# Patient Record
Sex: Female | Born: 1980 | Race: White | Hispanic: No | Marital: Single
Health system: Southern US, Community
[De-identification: ages and names within clinical notes are randomized; demographics above are authoritative.]

## PROBLEM LIST (undated history)

## (undated) DIAGNOSIS — F988 Other specified behavioral and emotional disorders with onset usually occurring in childhood and adolescence: Secondary | ICD-10-CM

## (undated) DIAGNOSIS — N809 Endometriosis, unspecified: Secondary | ICD-10-CM

## (undated) DIAGNOSIS — Z87440 Personal history of urinary (tract) infections: Secondary | ICD-10-CM

## (undated) HISTORY — PX: ABDOMINAL SURGERY: SHX537

---

## 2008-06-13 ENCOUNTER — Encounter: Admission: RE | Admit: 2008-06-13 | Discharge: 2008-06-13 | Payer: Self-pay | Admitting: Unknown Physician Specialty

## 2011-05-06 ENCOUNTER — Ambulatory Visit
Admission: RE | Admit: 2011-05-06 | Discharge: 2011-05-06 | Disposition: A | Discharge status: Home / Self Care | Payer: Self-pay | Source: Ambulatory Visit | Attending: Orthopedic Surgery | Admitting: Orthopedic Surgery

## 2011-05-06 ENCOUNTER — Other Ambulatory Visit: Payer: Self-pay | Admitting: Orthopedic Surgery

## 2011-05-06 DIAGNOSIS — M545 Low back pain, unspecified: Secondary | ICD-10-CM

## 2013-01-27 IMAGING — CR DG LUMBAR SPINE 2-3V
3 series · 3 of 3 positions shown · non-contrast
Comparison: 06/13/2008.

CLINICAL DATA: Low back pain.  No known injury.

LUMBAR SPINE - 2-3 VIEW

[view not recorded (1 of 3)]
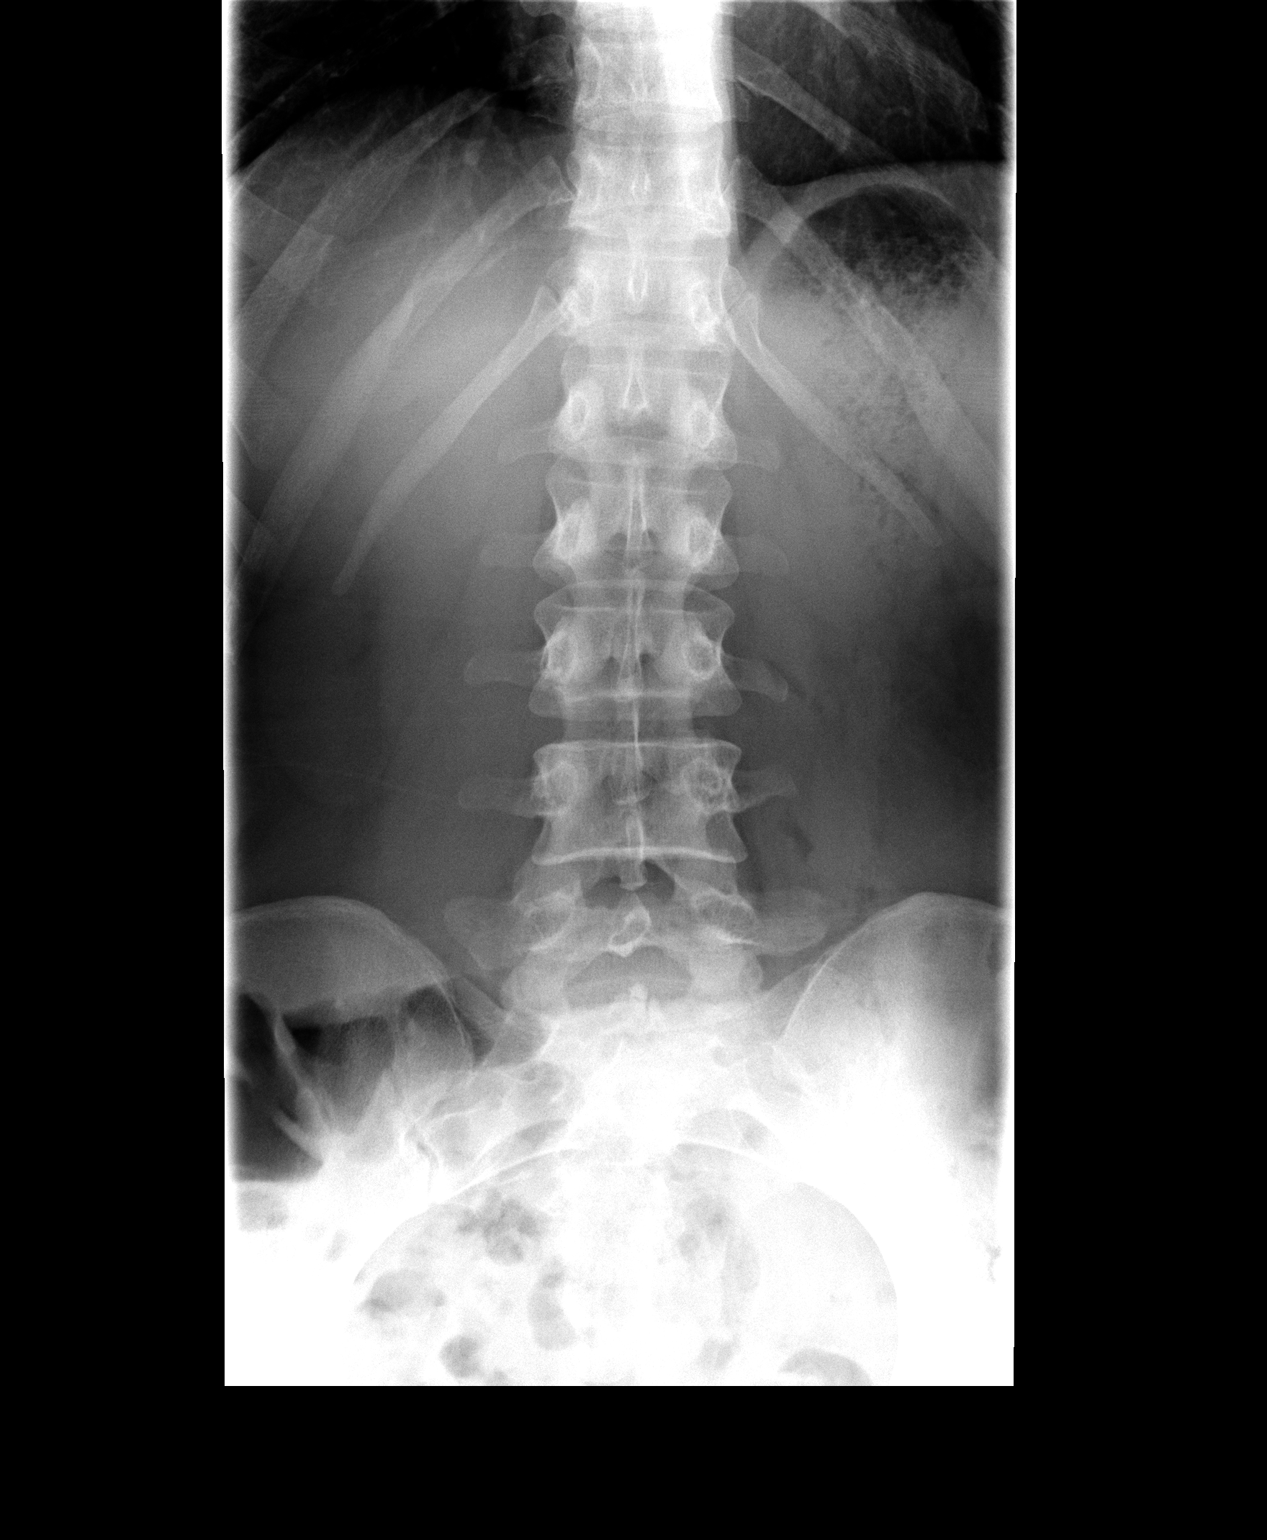

[view not recorded (2 of 3)]
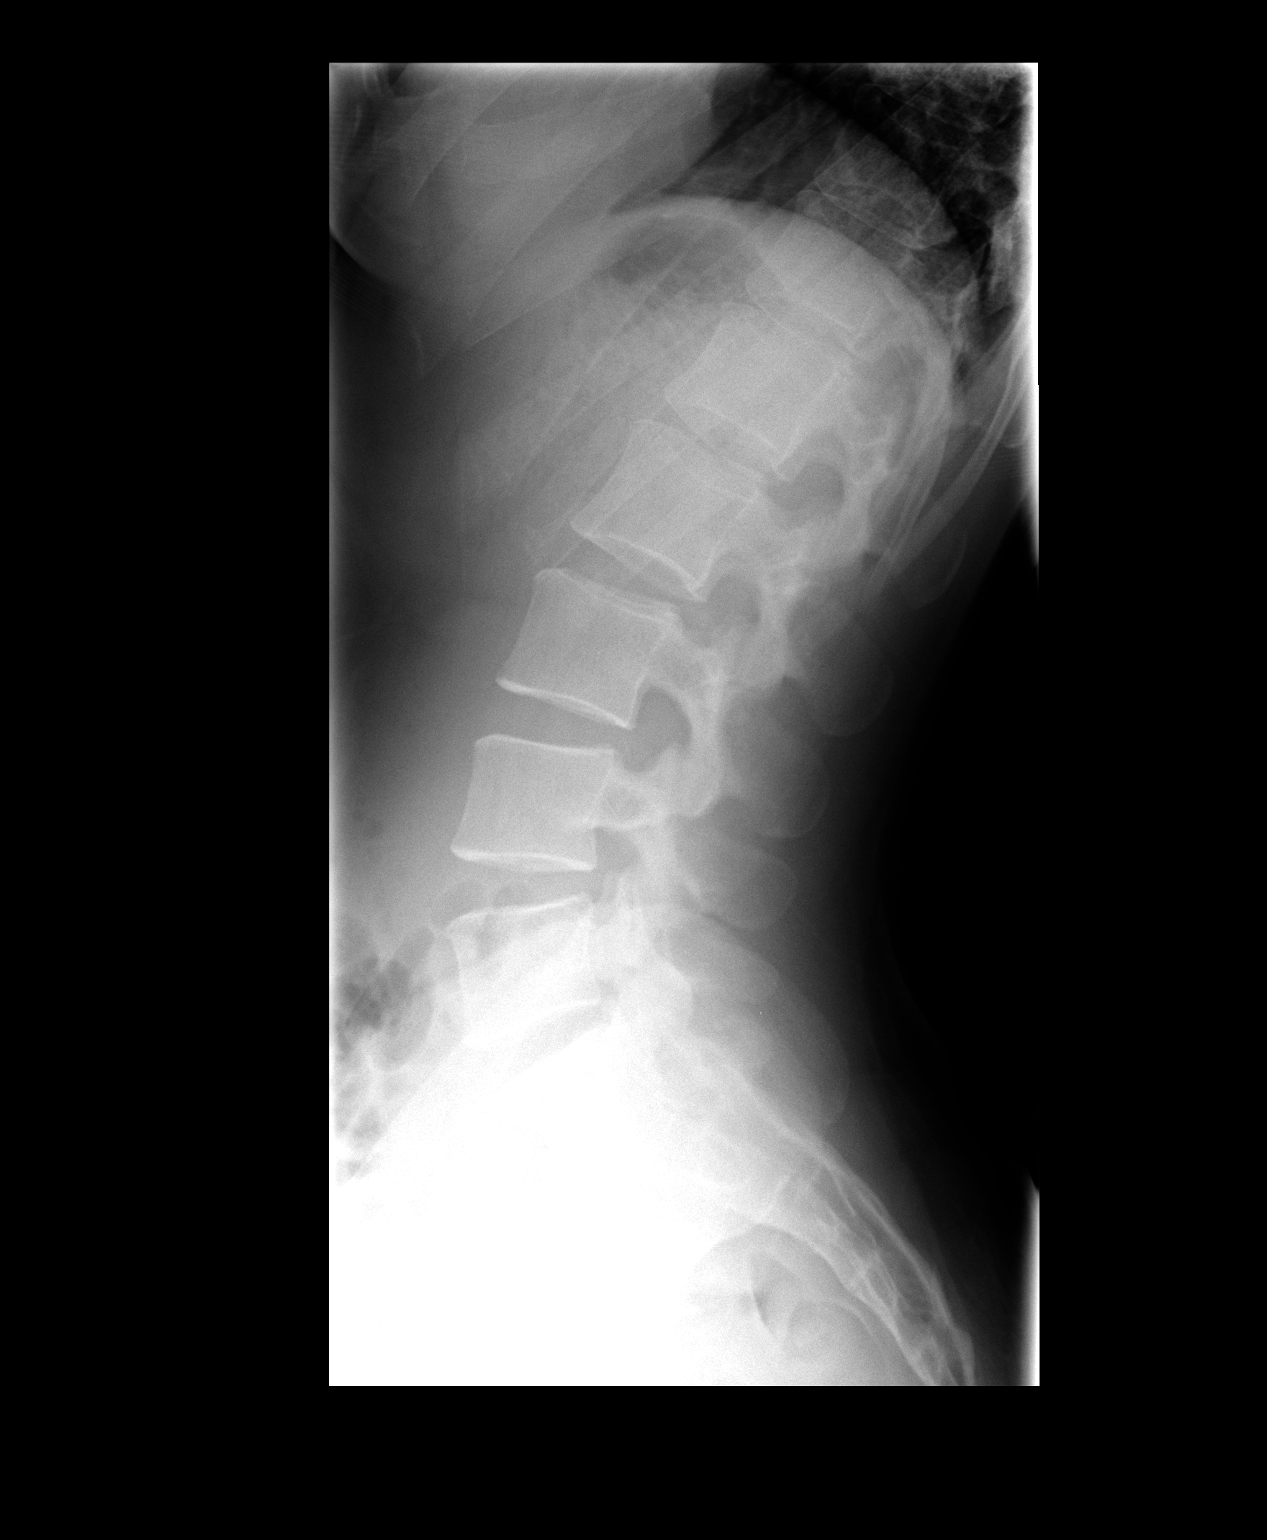

[view not recorded (3 of 3)]
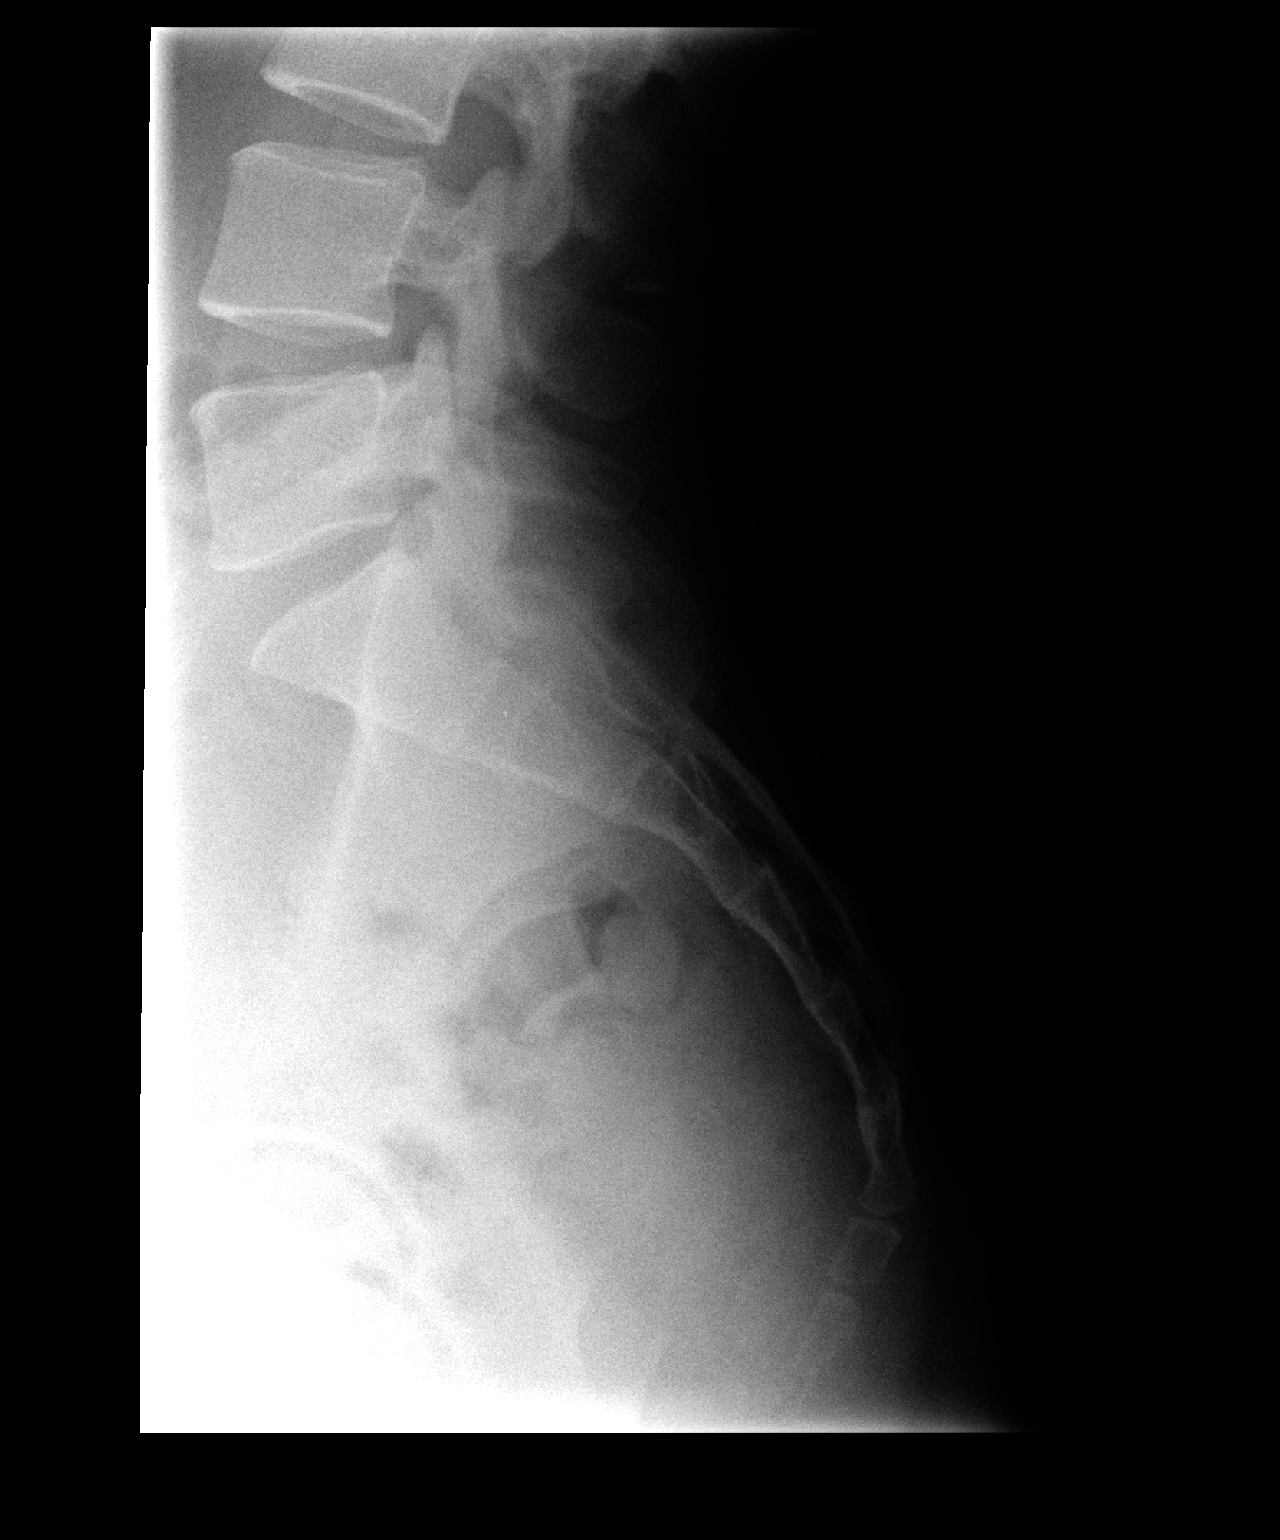

[3 of 3 positions shown; findings below may reference images not displayed]

FINDINGS: There is no evidence of lumbar spine fracture.
Alignment is normal.  Intervertebral disc spaces are maintained. No
change from priors.
IMPRESSION: Negative.

## 2019-02-20 ENCOUNTER — Other Ambulatory Visit: Payer: Self-pay

## 2019-02-20 ENCOUNTER — Encounter: Payer: Self-pay | Admitting: Emergency Medicine

## 2019-02-20 ENCOUNTER — Emergency Department
Admission: EM | Admit: 2019-02-20 | Discharge: 2019-02-20 | Disposition: A | Discharge status: Home / Self Care | Payer: BLUE CROSS/BLUE SHIELD | Source: Home / Self Care | Attending: Emergency Medicine | Admitting: Emergency Medicine

## 2019-02-20 DIAGNOSIS — N3091 Cystitis, unspecified with hematuria: Secondary | ICD-10-CM | POA: Diagnosis not present

## 2019-02-20 HISTORY — DX: Endometriosis, unspecified: N80.9

## 2019-02-20 HISTORY — DX: Personal history of urinary (tract) infections: Z87.440

## 2019-02-20 LAB — POCT URINALYSIS DIP (MANUAL ENTRY)
Bilirubin, UA: NEGATIVE
Glucose, UA: NEGATIVE mg/dL
Ketones, POC UA: NEGATIVE mg/dL
Nitrite, UA: NEGATIVE
Spec Grav, UA: 1.02 (ref 1.010–1.025)
Urobilinogen, UA: 0.2 E.U./dL
pH, UA: 7 (ref 5.0–8.0)

## 2019-02-20 LAB — POCT URINE PREGNANCY: Preg Test, Ur: NEGATIVE

## 2019-02-20 MED ORDER — FLUCONAZOLE 150 MG PO TABS: 150.0000 mg | ORAL_TABLET | Freq: Once | ORAL | 0 refills | Status: AC

## 2019-02-20 MED ORDER — CIPROFLOXACIN HCL 250 MG PO TABS: 250.0000 mg | ORAL_TABLET | Freq: Two times a day (BID) | ORAL | 0 refills | Status: AC

## 2019-02-20 MED ORDER — IBUPROFEN 600 MG PO TABS
600.0000 mg | ORAL_TABLET | Freq: Once | ORAL | Status: AC
  Administered 2019-02-20: 600 mg via ORAL

## 2019-02-20 NOTE — Discharge Instructions (Addendum)
Please strain your urine. Take antibiotics as instructed. Please avoid pregnancy since you are on Cipro. Please be seen immediately if you develop significant fever or flank pain. You have Diflucan to take for secondary yeast infection Take Azo-Standard until your antibiotics start to work. If you are unable to get relief on antibiotics and Azo-Standard you will need to go to the emergency room for pain medication.

## 2019-02-20 NOTE — ED Provider Notes (Addendum)
[facility]    CSN: [id number] Arrival date & time: 02/20/19  1027      History   Chief Complaint Chief Complaint  Patient presents with  . Dysuria  . Urinary Frequency  Patient states she has had some recent dental problems.  She was on amoxicillin until Thursday.  Since that time she has had severe suprapubic abdominal pain associated with urinary urgency and severe pain on urination.  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  She is in a stable relationship.  She has no STD concerns.  She has not had a discharge.  She has had no itching in the vaginal area or a whitish discharge.  She does have a history of recurrent urinary tract infections but had been doing well recently.  HPI Frances Thomas is a 38 y.o. female.   HPI  Past Medical History:  Diagnosis Date  . Endometriosis   . History of recurrent UTIs     There are no active problems to display for this patient.   Past Surgical History:  Procedure Laterality Date  . ABDOMINAL SURGERY      OB History   No obstetric history on file.      Home Medications    Prior to Admission medications   Medication Sig Start Date End Date Taking? Authorizing Provider  ciprofloxacin (CIPRO) 250 MG tablet Take 1 tablet (250 mg total) by mouth every 12 (twelve) hours. 02/20/19   Collene Gobbleaub, Nolita Kutter A, MD  fluconazole (DIFLUCAN) 150 MG tablet Take 1 tablet (150 mg total) by mouth once for 1 dose. Repeat if needed 02/20/19 02/20/19  Collene Gobbleaub, Aleen Marston A, MD    Family History No family history on file.  Social History Social History   Tobacco Use  . Smoking status: Not on file  Substance Use Topics  . Alcohol use: Not on file  . Drug use: Not on file     Allergies   Doxycycline and Macrobid [nitrofurantoin macrocrystal]   Review of Systems Review of Systems  Constitutional: Negative for chills and fever.  Gastrointestinal: Negative for nausea and vomiting.  Genitourinary:  Positive for decreased urine volume, difficulty urinating, dysuria, frequency and urgency. Negative for flank pain, genital sores and menstrual problem.       Last menstrual period was approximately 3 weeks ago.  Musculoskeletal: Negative.      Physical Exam Triage Vital Signs ED Triage Vitals  Enc Vitals Group     BP 02/20/19 1114 112/80     Pulse Rate 02/20/19 1114 82     Resp 02/20/19 1114 18     Temp 02/20/19 1114 98 F (36.7 C)     Temp Source 02/20/19 1114 Oral     SpO2 02/20/19 1114 99 %     Weight 02/20/19 1115 140 lb (63.5 kg)     Height 02/20/19 1115 5\' 7"  (1.702 m)     Head Circumference --      Peak Flow --      Pain Score 02/20/19 1115 9     Pain Loc --      Pain Edu? --      Excl. in GC? --    No data found.  Updated Vital Signs BP 112/80 (BP Location: Right Arm)   Pulse 82   Temp 98 F (36.7 C) (Oral)   Resp 18   Ht 5\' 7"  (1.702 m)   Wt 63.5 kg   LMP 01/28/2019   SpO2  99%   BMI 21.93 kg/m   Visual Acuity Right Eye Distance:   Left Eye Distance:   Bilateral Distance:    Right Eye Near:   Left Eye Near:    Bilateral Near:     Physical Exam Constitutional:      Comments: Patient sitting in the corner of the room.  She is not in any distress.  Cardiovascular:     Rate and Rhythm: Normal rate.  Pulmonary:     Effort: Pulmonary effort is normal.  Abdominal:     General: Abdomen is flat.     Palpations: Abdomen is soft.     Comments: There is significant suprapubic tenderness.  There is no adnexal tenderness.      UC Treatments / Results  Labs (all labs ordered are listed, but only abnormal results are displayed) Labs Reviewed  POCT URINALYSIS DIP (MANUAL ENTRY) - Abnormal; Notable for the following components:      Result Value   Clarity, UA cloudy (*)    Blood, UA moderate (*)    Protein Ur, POC trace (*)    Leukocytes, UA Moderate (2+) (*)    All other components within normal limits  URINE CULTURE  POCT URINE PREGNANCY    EKG    Radiology No results found.  Procedures Procedures (including critical care time)  Medications Ordered in UC Medications  ibuprofen (ADVIL) tablet 600 mg (600 mg Oral Given 02/20/19 1158)    Initial Impression / Assessment and Plan / UC Course  I have reviewed the triage vital signs and the nursing notes.  Culture will be done.  If pregnancy test is negative will treat with Cipro 250 twice daily.  She was given Diflucan to take for secondary yeast infection.  She was advised to take Azo-Standard to help with urinary symptoms there was moderate blood and moderate leukocytes on her urinalysis.  I doubt this is stone disease but gave her strainer in case.  She was given 600 mg of ibuprofen in the office for pain relief. Pertinent labs & imaging results that were available during my care of the patient were reviewed by me and considered in my medical decision making (see chart for details).      Final Clinical Impressions(s) / UC Diagnoses   Final diagnoses:  Hemorrhagic cystitis     Discharge Instructions     Please strain your urine. Take antibiotics as instructed. Please avoid pregnancy since you are on Cipro. Please be seen immediately if you develop significant fever or flank pain. You have Diflucan to take for secondary yeast infection Take Azo-Standard until your antibiotics start to work. If you are unable to get relief on antibiotics and Azo-Standard you will need to go to the emergency room for pain medication.    ED Prescriptions    Medication Sig Dispense Auth. Provider   fluconazole (DIFLUCAN) 150 MG tablet Take 1 tablet (150 mg total) by mouth once for 1 dose. Repeat if needed 2 tablet Darlyne Russian, MD   ciprofloxacin (CIPRO) 250 MG tablet Take 1 tablet (250 mg total) by mouth every 12 (twelve) hours. 10 tablet Darlyne Russian, MD     Controlled Substance Prescriptions [place] Controlled Substance Registry consulted? Not Applicable   Darlyne Russian, MD  02/20/19 1311    Darlyne Russian, MD 02/20/19 1312

## 2019-02-20 NOTE — ED Triage Notes (Signed)
Patient reports being on amoxicillin for dental problems until yesterday. She began having dysuria and frequency yesterday which has worsened and she is on point of tears. She has not taken OTCs.  She has not travelled past 4 weeks.

## 2019-02-21 ENCOUNTER — Telehealth: Payer: Self-pay | Admitting: Emergency Medicine

## 2019-02-21 LAB — URINE CULTURE
MICRO NUMBER:: 875891
SPECIMEN QUALITY:: ADEQUATE

## 2019-02-21 NOTE — Telephone Encounter (Signed)
Left VM to call us with her status update. Per Dr.Daub, she may need injection of antibiotics or other testing to determine cause of her pain.

## 2019-02-22 ENCOUNTER — Telehealth: Payer: Self-pay | Admitting: Emergency Medicine

## 2019-02-22 NOTE — Telephone Encounter (Signed)
Dr Everlene Farrier wants her to follow up with PCP or Urology, I gave her info, [facility]

## 2020-02-05 ENCOUNTER — Encounter: Payer: Self-pay | Admitting: Emergency Medicine

## 2020-02-05 ENCOUNTER — Emergency Department: Admit: 2020-02-05 | Discharge status: Against Medical Advice | Payer: Self-pay

## 2020-02-05 ENCOUNTER — Emergency Department
Admission: EM | Admit: 2020-02-05 | Discharge: 2020-02-05 | Disposition: A | Discharge status: Home / Self Care | Payer: BLUE CROSS/BLUE SHIELD | Source: Home / Self Care | Attending: Emergency Medicine | Admitting: Emergency Medicine

## 2020-02-05 ENCOUNTER — Other Ambulatory Visit: Payer: Self-pay

## 2020-02-05 DIAGNOSIS — N3000 Acute cystitis without hematuria: Secondary | ICD-10-CM

## 2020-02-05 HISTORY — DX: Other specified behavioral and emotional disorders with onset usually occurring in childhood and adolescence: F98.8

## 2020-02-05 MED ORDER — FLUCONAZOLE 150 MG PO TABS: 150.0000 mg | ORAL_TABLET | Freq: Once | ORAL | 0 refills | Status: AC

## 2020-02-05 MED ORDER — PHENAZOPYRIDINE HCL 200 MG PO TABS
200.0000 mg | ORAL_TABLET | Freq: Three times a day (TID) | ORAL | 0 refills | Status: AC | PRN
Start: 2020-02-05 — End: ?

## 2020-02-05 NOTE — ED Provider Notes (Signed)
[facility]    CSN: [id number] Arrival date & time: 02/05/20  1121      History   Chief Complaint Chief Complaint  Patient presents with  . Urinary Frequency  . Urinary Urgency  . Dysuria    HPI Frances Thomas is a 39 y.o. female.   HPI  Frances Thomas is a 39 y.o. female presents for evaluation of urinary frequency, urgency and dysuria x 2 days, without flank pain, fever, chills, or abnormal vaginal discharge or bleeding. Reports history of recurrent UTI. PCP previous called in a full prescription for Cipro which she started today. She is in for urine culture. Uncertain of prior urine pathology. Patient's last menstrual period was 01/21/2020 (approximate).  Past Medical History:  Diagnosis Date  . ADD (attention deficit disorder)   . Endometriosis   . History of recurrent UTIs     There are no problems to display for this patient.   Past Surgical History:  Procedure Laterality Date  . ABDOMINAL SURGERY      OB History   No obstetric history on file.      Home Medications    Prior to Admission medications   Medication Sig Start Date End Date Taking? Authorizing Provider  amphetamine-dextroamphetamine (ADDERALL) 20 MG tablet Take 20 mg by mouth daily.   Yes [provider]  SUMAtriptan (IMITREX) 25 MG tablet Take 25 mg by mouth every 2 (two) hours as needed for migraine. May repeat in 2 hours if headache persists or recurs.   Yes [provider]  ciprofloxacin (CIPRO) 250 MG tablet Take 1 tablet (250 mg total) by mouth every 12 (twelve) hours. 02/20/19   Collene Gobble, MD    Family History No family history on file.  Social History Social History   Tobacco Use  . Smoking status: Not on file  Substance Use Topics  . Alcohol use: Not on file  . Drug use: Not on file     Allergies   Doxycycline and Macrobid [nitrofurantoin macrocrystal]   Review of Systems Review of Systems Pertinent negatives listed in  HPI Physical Exam Triage Vital Signs ED Triage Vitals  Enc Vitals Group     BP 02/05/20 1150 115/81     Pulse Rate 02/05/20 1150 86     Resp 02/05/20 1150 18     Temp 02/05/20 1150 98.2 F (36.8 C)     Temp Source 02/05/20 1150 Oral     SpO2 02/05/20 1150 97 %     Weight 02/05/20 1152 140 lb (63.5 kg)     Height 02/05/20 1152 5\' 7"  (1.702 m)     Head Circumference --      Peak Flow --      Pain Score 02/05/20 1152 8     Pain Loc --      Pain Edu? --      Excl. in GC? --    No data found.  Updated Vital Signs BP 115/81 (BP Location: Right Arm)   Pulse 86   Temp 98.2 F (36.8 C) (Oral)   Resp 18   Ht 5\' 7"  (1.702 m)   Wt 140 lb (63.5 kg)   LMP 01/21/2020 (Approximate)   SpO2 97%   BMI 21.93 kg/m   Visual Acuity Right Eye Distance:   Left Eye Distance:   Bilateral Distance:    Right Eye Near:   Left Eye Near:    Bilateral Near:     Physical Exam General appearance: alert, well developed,  well nourished Head: Normocephalic, without obvious abnormality, atraumatic Respiratory: Respirations even and unlabored. Heart: rate and rhythm normal.  Extremities: No gross deformities Skin: Skin color, texture, turgor normal. No rashes seen  Psych: Appropriate mood and affect. UC Treatments / Results  Labs (all labs ordered are listed, but only abnormal results are displayed) Labs Reviewed - No data to display  EKG   Radiology No results found.  Procedures Procedures (including critical care time)  Medications Ordered in UC Medications - No data to display  Initial Impression / Assessment and Plan / UC Course  I have reviewed the triage vital signs and the nursing notes.  Pertinent labs & imaging results that were available during my care of the patient were reviewed by me and considered in my medical decision making (see chart for details).     Patient will continue ciprofloxacin prescribed by PCP while urine culture pending. Pyridium and Diflucan  prescribed. Red flags discussed.  UA not performed due to patient has taken large amounts of pyridium. Urine culture pending. An After Visit Summary was printed and given to the patient/family. Precautions discussed. Red flags discussed. Questions invited and answered. They voiced understanding and agreement.     Final Clinical Impressions(s) / UC Diagnoses   Final diagnoses:  Acute cystitis without hematuria     Discharge Instructions     If symptoms worsen or become severe, go to the Emergency department.  Urine culture results should be available  72 hours.    ED Prescriptions    Medication Sig Dispense Auth. Provider   phenazopyridine (PYRIDIUM) 200 MG tablet Take 1 tablet (200 mg total) by mouth 3 (three) times daily as needed for pain. 14 tablet Bing Neighbors, FNP   fluconazole (DIFLUCAN) 150 MG tablet Take 1 tablet (150 mg total) by mouth once for 1 dose. Repeat if needed 2 tablet Bing Neighbors, FNP     PDMP not reviewed this encounter.   Bing Neighbors, FNP 02/05/20 1220

## 2020-02-05 NOTE — Discharge Instructions (Addendum)
If symptoms worsen or become severe, go to the Emergency department.  Urine culture results should be available  72 hours.

## 2020-02-05 NOTE — ED Triage Notes (Signed)
Patient reports frequency, urgency and dysuria increasing over past 2 days; last night took Pyridium and this morning started rx on hand of Cipro. Has had covid vaccinations.

## 2020-02-07 LAB — TIQ- MISLABELED: Test Ordered On Req: 395

## 2020-02-09 LAB — URINE CULTURE
MICRO NUMBER:: 10898203
Result:: NO GROWTH
SPECIMEN QUALITY:: ADEQUATE

## 2020-02-09 LAB — PAT ID TIQ DOC: Test Affected: 395
